# Patient Record
Sex: Female | Born: 1975 | Race: White | Hispanic: No | Marital: Married | Smoking: Never smoker
Health system: Southern US, Community
[De-identification: ages and names within clinical notes are randomized; demographics above are authoritative.]

## PROBLEM LIST (undated history)

## (undated) DIAGNOSIS — R519 Headache, unspecified: Secondary | ICD-10-CM

## (undated) DIAGNOSIS — R51 Headache: Secondary | ICD-10-CM

## (undated) DIAGNOSIS — Z6791 Unspecified blood type, Rh negative: Secondary | ICD-10-CM

## (undated) DIAGNOSIS — O26899 Other specified pregnancy related conditions, unspecified trimester: Secondary | ICD-10-CM

## (undated) HISTORY — PX: KNEE ARTHROSCOPY: SHX127

## (undated) HISTORY — PX: FACIAL COSMETIC SURGERY: SHX629

---

## 2006-08-30 ENCOUNTER — Emergency Department: Payer: Self-pay

## 2006-12-03 ENCOUNTER — Encounter: Payer: Self-pay | Admitting: Maternal and Fetal Medicine

## 2007-04-12 ENCOUNTER — Encounter: Payer: Self-pay | Admitting: Maternal & Fetal Medicine

## 2008-06-11 ENCOUNTER — Ambulatory Visit: Payer: Self-pay | Admitting: Internal Medicine

## 2011-03-08 ENCOUNTER — Emergency Department: Payer: Self-pay | Admitting: Emergency Medicine

## 2011-12-05 ENCOUNTER — Ambulatory Visit: Payer: Self-pay | Admitting: Internal Medicine

## 2011-12-05 LAB — URINALYSIS, COMPLETE

## 2011-12-05 LAB — WET PREP, GENITAL

## 2011-12-05 LAB — PREGNANCY, URINE: Pregnancy Test, Urine: NEGATIVE m[IU]/mL

## 2017-10-12 ENCOUNTER — Encounter: Payer: Self-pay | Admitting: Urology

## 2017-10-12 ENCOUNTER — Ambulatory Visit: Payer: BC Managed Care – PPO | Admitting: Urology

## 2017-10-12 VITALS — BP 102/70 | HR 88 | Ht 63.0 in | Wt 170.3 lb

## 2017-10-12 DIAGNOSIS — R32 Unspecified urinary incontinence: Secondary | ICD-10-CM | POA: Diagnosis not present

## 2017-10-12 LAB — URINALYSIS, COMPLETE
Bilirubin, UA: NEGATIVE
Glucose, UA: NEGATIVE
KETONES UA: NEGATIVE
Leukocytes, UA: NEGATIVE
NITRITE UA: NEGATIVE
Protein, UA: NEGATIVE
RBC, UA: NEGATIVE
Specific Gravity, UA: <1.005 — ABNORMAL LOW (ref 1.005–1.030)
UUROB: 0.2 mg/dL (ref 0.2–1.0)
pH, UA: 5.5 (ref 5.0–7.5)

## 2017-10-12 NOTE — Progress Notes (Signed)
10/12/2017 2:55 PM   Leslie Blanchard 07/04/1975 [id number]  Referring provider: No referring provider defined for this encounter.  Chief Complaint  Patient presents with  . Urinary Incontinence    HPI: I was consulted to assist the patient's urinary incontinence worsening in the last year.  She leaks with coughing sneezing exercise.  She has no urge incontinence or bedwetting.  She generally does not wear a pad but she can wear 2 or 3 when she is active and leak quite a bit.  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  She normally does not have nocturia.  Her flow is reasonable  She denies a history of kidney stones previous GU surgery and urinary tract infections.  She has not had a hysterectomy.  No neurologic issues.  Presentation is not been medically treated  Modifying factors: There are no other modifying factors  Associated signs and symptoms: There are no other associated signs and symptoms Aggravating and relieving factors: There are no other aggravating or relieving factors Severity: Moderate Duration: Persistent   PMH: History reviewed. No pertinent past medical history.  Surgical History:   Home Medications:  Allergies as of 10/12/2017      Reactions   Citalopram    Other reaction(s): Other (See Comments) Weight gain      Medication List        Accurate as of 10/12/17  2:55 PM. Always use your most recent med list.          cetirizine 10 MG tablet Commonly known as:  ZYRTEC Take by mouth.       Allergies:  Allergies  Allergen Reactions  . Citalopram     Other reaction(s): Other (See Comments) Weight gain    Family History: Family History  Problem Relation Age of Onset  . Prostate cancer Maternal Grandfather     Social History:  reports that she has never smoked. She has never used smokeless tobacco. She reports that she drinks alcohol. She reports that she does  not use drugs.  ROS: UROLOGY Frequent Urination?: Yes Hard to postpone urination?: No Burning/pain with urination?: No Get up at night to urinate?: No Leakage of urine?: Yes Urine stream starts and stops?: Yes Trouble starting stream?: No Do you have to strain to urinate?: No Blood in urine?: No Urinary tract infection?: No Sexually transmitted disease?: No Injury to kidneys or bladder?: No Painful intercourse?: No Weak stream?: No Currently pregnant?: No Vaginal bleeding?: No Last menstrual period?: n  Gastrointestinal Nausea?: No Vomiting?: No Indigestion/heartburn?: No Diarrhea?: No Constipation?: No  Constitutional Fever: No Night sweats?: No Weight loss?: No Fatigue?: No  Skin Skin rash/lesions?: No Itching?: No  Eyes Blurred vision?: No Double vision?: No  Ears/Nose/Throat Sore throat?: No Sinus problems?: No  Hematologic/Lymphatic Swollen glands?: No Easy bruising?: No  Cardiovascular Leg swelling?: No Chest pain?: No  Respiratory Cough?: No Shortness of breath?: No  Endocrine Excessive thirst?: No  Musculoskeletal Back pain?: No Joint pain?: No  Neurological Headaches?: No Dizziness?: No  Psychologic Depression?: No Anxiety?: No  Physical Exam: BP 102/70 (BP Location: Left Arm, Patient Position: Sitting, Cuff Size: Large)   Pulse 88   Ht 5\' 3"  (1.6 m)   Wt 170 lb 4.8 oz (77.2 kg)   BMI 30.17 kg/m   Constitutional:  Alert and oriented, No acute distress. HEENT: [place] AT, moist mucus membranes.  Trachea midline, no masses. Cardiovascular:  No clubbing, cyanosis, or edema. Respiratory: Normal respiratory effort, no increased work of breathing. GI: Abdomen is soft, nontender, nondistended, no abdominal masses GU: No CVA tenderness.  Mild grade 2 hypermobility the bladder neck and negative cough test and no prolapse Skin: No rashes, bruises or suspicious lesions. Lymph: No cervical or inguinal adenopathy. Neurologic: Grossly  intact, no focal deficits, moving all 4 extremities. Psychiatric: Normal mood and affect.  Laboratory Data: No results found for: WBC, HGB, HCT, MCV, PLT  No results found for: CREATININE  No results found for: PSA  No results found for: TESTOSTERONE  No results found for: HGBA1C  Urinalysis    Component Value Date/Time   COLORURINE RED 12/05/2011 1122   APPEARANCEUR BLOODY 12/05/2011 1122   LABSPEC SEE COMMENT 12/05/2011 1122   PHURINE see comment 12/05/2011 1122   GLUCOSEU see comment 12/05/2011 1122   HGBUR see comment 12/05/2011 1122   BILIRUBINUR SEE COMMENT 12/05/2011 1122   KETONESUR SEE COMMENT 12/05/2011 1122   PROTEINUR SEE COMMENT 12/05/2011 1122   NITRITE SEE COMMENT 12/05/2011 1122   LEUKOCYTESUR SEE COMMENT 12/05/2011 1122    Pertinent Imaging:   Assessment & Plan: Patient has stress urinary incontinence.  Her frequency is somewhat fluid dependent.  Role of urodynamics discussed.  Role of physical therapy discussed.  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  She would like to first get urodynamics which was reasonable  1. Urinary incontinence, unspecified type  - Urinalysis, Complete   No follow-ups on file.  Martina SinnerMACDIARMID,Vincente Asbridge A, MD  [facility] [address], [address] [place], [place] [address] [phone]

## 2018-01-05 ENCOUNTER — Other Ambulatory Visit: Payer: Self-pay | Admitting: Urology

## 2018-01-11 ENCOUNTER — Encounter: Payer: Self-pay | Admitting: Urology

## 2018-01-11 ENCOUNTER — Ambulatory Visit (INDEPENDENT_AMBULATORY_CARE_PROVIDER_SITE_OTHER): Payer: BC Managed Care – PPO | Admitting: Urology

## 2018-01-11 VITALS — BP 94/62 | HR 88 | Ht 63.0 in | Wt 168.0 lb

## 2018-01-11 DIAGNOSIS — N3946 Mixed incontinence: Secondary | ICD-10-CM

## 2018-01-11 NOTE — Progress Notes (Signed)
01/11/2018 3:47 PM   Sharyn Dross Apr 03, 1975 [id number]  Referring provider: No referring provider defined for this encounter.  No chief complaint on file.   HPI: I was consulted to assist the patient's urinary incontinence worsening in the last year.  She leaks with coughing sneezing exercise.  She has no urge incontinence or bedwetting.  She generally does not wear a pad but she can wear 2 or 3 when she is active and leak quite a bit.  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  She normally does not have nocturia.  Her flow is reasonable    Mild grade 2 hypermobility the bladder neck and negative cough test and no prolapse  Patient has stress urinary incontinence.  Her frequency is somewhat fluid dependent.  Role of urodynamics discussed.  Role of physical therapy discussed.  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   Today Frequency and incontinence are stable On urodynamics patient empty efficiently.  Maximum bladder capacity was 610 mL.  Bladder was unstable reaching a pressure of 9 cm of water and she did not leak but felt urgency.  She did not leak with a Valsalva pressure of 111 cm of water.  During voluntary voiding she voided 110 mL with a maximum flow of 8 mils per second.  Maximum voiding pressure was 24 cm of water.  Residual was 500 mL and then double voided after the test a similar amount.  EMG activity increased during the voiding phase.  Bladder neck descent at 1 cm.  The urodynamic note was signed and dictated and reviewed.  I discussed watchful waiting versus physical therapy versus a sling with my usual template.  Mesh issues discussed.   PMH: No past medical history on file.  Surgical History: No past surgical history on file.  Home Medications:  Allergies as of 01/11/2018      Reactions   Citalopram    Other reaction(s): Other (See  Comments) Weight gain      Medication List        Accurate as of 01/11/18  3:47 PM. Always use your most recent med list.          cetirizine 10 MG tablet Commonly known as:  ZYRTEC Take by mouth.       Allergies:  Allergies  Allergen Reactions  . Citalopram     Other reaction(s): Other (See Comments) Weight gain    Family History: Family History  Problem Relation Age of Onset  . Prostate cancer Maternal Grandfather     Social History:  reports that she has never smoked. She has never used smokeless tobacco. She reports that she drinks alcohol. She reports that she does not use drugs.  ROS:                                        Physical Exam: There were no vitals taken for this visit.  Constitutional:  Alert and oriented, No acute distress.   Laboratory Data: No results found for: WBC, HGB, HCT, MCV, PLT  No results found for: CREATININE  No results found for: PSA  No results found for: TESTOSTERONE  No results found for: HGBA1C  Urinalysis    Component Value Date/Time   COLORURINE RED  12/05/2011 1122   APPEARANCEUR Clear 10/12/2017 1437   LABSPEC SEE COMMENT 12/05/2011 1122   PHURINE see comment 12/05/2011 1122   GLUCOSEU Negative 10/12/2017 1437   GLUCOSEU see comment 12/05/2011 1122   HGBUR see comment 12/05/2011 1122   BILIRUBINUR Negative 10/12/2017 1437   BILIRUBINUR SEE COMMENT 12/05/2011 1122   KETONESUR SEE COMMENT 12/05/2011 1122   PROTEINUR Negative 10/12/2017 1437   PROTEINUR SEE COMMENT 12/05/2011 1122   NITRITE Negative 10/12/2017 1437   NITRITE SEE COMMENT 12/05/2011 1122   LEUKOCYTESUR Negative 10/12/2017 1437   LEUKOCYTESUR SEE COMMENT 12/05/2011 1122    Pertinent Imaging:   Assessment & Plan: 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  She wants to talk to her husband but likely will call and proceed with surgery.  Yes I can do it in  [place].  In public at school and at a game she leaked a lot sitting and coughing and it was very embarrassed  There are no diagnoses linked to this encounter.  No follow-ups on file.  Martina Sinner, MD  [facility] [address] [place], [place] [address] [phone]

## 2018-01-12 ENCOUNTER — Telehealth: Payer: Self-pay | Admitting: Radiology

## 2018-01-12 NOTE — Telephone Encounter (Signed)
Patient would like to proceed with sling bladder suspension with Dr Sherron Monday.  Dr Sherron Monday - need orders please.

## 2018-01-13 ENCOUNTER — Other Ambulatory Visit: Payer: Self-pay | Admitting: Radiology

## 2018-01-13 NOTE — Telephone Encounter (Signed)
LMOM. Need to discuss pubovaginal sling with Dr Sherron Monday.

## 2018-01-13 NOTE — Telephone Encounter (Signed)
Surgery scheduled on 02/22/2018 with Dr Sherron Monday. Appointments for [facility] teaching & post op have been made. Patient aware. Questions answered. Patient voices understanding.

## 2018-01-20 ENCOUNTER — Other Ambulatory Visit: Payer: Self-pay | Admitting: Radiology

## 2018-01-20 DIAGNOSIS — N393 Stress incontinence (female) (male): Secondary | ICD-10-CM

## 2018-01-20 MED ORDER — PHENAZOPYRIDINE HCL 100 MG PO TABS: 100.0000 mg | ORAL_TABLET | Freq: Once | ORAL | 0 refills | Status: AC

## 2018-02-15 ENCOUNTER — Encounter: Payer: Self-pay | Admitting: *Deleted

## 2018-02-15 ENCOUNTER — Inpatient Hospital Stay: Admission: RE | Admit: 2018-02-15 | Payer: Self-pay | Source: Ambulatory Visit

## 2018-02-15 ENCOUNTER — Encounter
Admission: RE | Admit: 2018-02-15 | Discharge: 2018-02-15 | Disposition: A | Discharge status: Home / Self Care | Payer: Self-pay | Source: Ambulatory Visit | Attending: Urology | Admitting: Urology

## 2018-02-15 ENCOUNTER — Other Ambulatory Visit: Payer: Self-pay

## 2018-02-15 HISTORY — DX: Other specified pregnancy related conditions, unspecified trimester: O26.899

## 2018-02-15 HISTORY — DX: Headache: R51

## 2018-02-15 HISTORY — DX: Headache, unspecified: R51.9

## 2018-02-15 HISTORY — DX: Unspecified blood type, rh negative: Z67.91

## 2018-02-15 NOTE — Patient Instructions (Signed)
Your procedure is scheduled on: 02-22-18 Report to Same Day Surgery 2nd floor medical mall [facility](Medical Mall Entrance-take elevator on left to 2nd floor.  Check in with surgery information desk.) To find out your arrival time please call [phone] between 1PM - 3PM on 02-19-18  Remember: Instructions that are not followed completely may result in serious medical risk, up to and including death, or upon the discretion of your surgeon and anesthesiologist your surgery may need to be rescheduled.    _x___ 1. Do not eat food after midnight the night before your procedure. You may drink clear liquids up to 2 hours before you are scheduled to arrive at the hospital for your procedure.  Do not drink clear liquids within 2 hours of your scheduled arrival to the hospital.  Clear liquids include  --Water or Apple juice without pulp  --Clear carbohydrate beverage such as ClearFast or Gatorade  --Black Coffee or Clear Tea (No milk, no creamers, do not add anything to the coffee or Tea Type 1 and type 2 diabetics should only drink water.   ____Ensure clear carbohydrate drink on the way to the hospital for bariatric patients  ____Ensure clear carbohydrate drink 3 hours before surgery for Dr Rutherford NailByrnett's patients if physician instructed.   No gum chewing or hard candies.     __x__ 2. No Alcohol for 24 hours before or after surgery.   __x__3. No Smoking or e-cigarettes for 24 prior to surgery.  Do not use any chewable tobacco products for at least 6 hour prior to surgery   ____  4. Bring all medications with you on the day of surgery if instructed.    __x__ 5. Notify your doctor if there is any change in your medical condition     (cold, fever, infections).    x___6. On the morning of surgery brush your teeth with toothpaste and water.  You may rinse your mouth with mouth wash if you wish.  Do not swallow any toothpaste or mouthwash.   Do not wear jewelry, make-up, hairpins, clips or nail polish.  Do  not wear lotions, powders, or perfumes. You may wear deodorant.  Do not shave 48 hours prior to surgery. Men may shave face and neck.  Do not bring valuables to the hospital.    [facility] is not responsible for any belongings or valuables.               Contacts, dentures or bridgework may not be worn into surgery.  Leave your suitcase in the car. After surgery it may be brought to your room.  For patients admitted to the hospital, discharge time is determined by your treatment team.  _  Patients discharged the day of surgery will not be allowed to drive home.  You will need someone to drive you home and stay with you the night of your procedure.    Please read over the following fact sheets that you were given:   [facility] Preparing for Surgery   ____ Take anti-hypertensive listed below, cardiac, seizure, asthma,     anti-reflux and psychiatric medicines. These include:  1. NONE  2.  3.  4.  5.  6.  ____Fleets enema or Magnesium Citrate as directed.   ____ Use CHG Soap or sage wipes as directed on instruction sheet   ____ Use inhalers on the day of surgery and bring to hospital day of surgery  ____ Stop Metformin and Janumet 2 days prior to surgery.  ____ Take 1/2 of usual insulin dose the night before surgery and none on the morning     surgery.   ____ Follow recommendations from Cardiologist, Pulmonologist or PCP regarding          stopping Aspirin, Coumadin, Plavix ,Eliquis, Effient, or Pradaxa, and Pletal.  X____Stop Anti-inflammatories such as Advil, Aleve, Ibuprofen, Motrin, Naproxen, Naprosyn, Goodies powders or aspirin products NOW-OK to take Tylenol    ____ Stop supplements until after surgery.     ____ Bring C-Pap to the hospital.

## 2018-02-16 ENCOUNTER — Ambulatory Visit (INDEPENDENT_AMBULATORY_CARE_PROVIDER_SITE_OTHER): Payer: BC Managed Care – PPO

## 2018-02-16 ENCOUNTER — Inpatient Hospital Stay: Admission: RE | Admit: 2018-02-16 | Payer: Self-pay | Source: Ambulatory Visit

## 2018-02-16 DIAGNOSIS — N393 Stress incontinence (female) (male): Secondary | ICD-10-CM | POA: Diagnosis not present

## 2018-02-16 NOTE — Patient Instructions (Signed)
.  buas

## 2018-02-19 DIAGNOSIS — N393 Stress incontinence (female) (male): Secondary | ICD-10-CM | POA: Diagnosis not present

## 2018-02-19 DIAGNOSIS — Z888 Allergy status to other drugs, medicaments and biological substances status: Secondary | ICD-10-CM | POA: Diagnosis not present

## 2018-02-19 NOTE — Progress Notes (Signed)
Pharmacy consult for Gentamicin for Surgical Prophylaxis  42 yo F for pubo-vaginal sling surgery  Ht 63 in Wt 79.4 kg (from office visit on 01/12/18) Adjusted Body weight= 63 kg  Will order Gentamicin 5 mg/kg based on previous weight of 79.4 kg (using adjusted body weight of 63 kg for dosing).  Scr from 01/12/18= 0.7    Bari MantisKristin Gaelan Glennon PharmD Clinical Pharmacist 02/19/2018

## 2018-02-21 MED ORDER — CEFAZOLIN SODIUM-DEXTROSE 2-4 GM/100ML-% IV SOLN
2.0000 g | INTRAVENOUS | Status: AC
  Administered 2018-02-22: 2 g via INTRAVENOUS

## 2018-02-22 ENCOUNTER — Other Ambulatory Visit: Payer: Self-pay

## 2018-02-22 ENCOUNTER — Encounter
Admission: RE | Disposition: A | Discharge status: Home / Self Care | Payer: Self-pay | Source: Ambulatory Visit | Attending: Urology

## 2018-02-22 ENCOUNTER — Ambulatory Visit
Admission: RE | Admit: 2018-02-22 | Discharge: 2018-02-22 | Disposition: A | Discharge status: Home / Self Care | Payer: BC Managed Care – PPO | Source: Ambulatory Visit | Attending: Urology | Admitting: Urology

## 2018-02-22 ENCOUNTER — Ambulatory Visit: Payer: BC Managed Care – PPO | Admitting: Certified Registered"

## 2018-02-22 DIAGNOSIS — N393 Stress incontinence (female) (male): Secondary | ICD-10-CM | POA: Diagnosis not present

## 2018-02-22 DIAGNOSIS — Z888 Allergy status to other drugs, medicaments and biological substances status: Secondary | ICD-10-CM | POA: Insufficient documentation

## 2018-02-22 HISTORY — PX: PUBOVAGINAL SLING: SHX1035

## 2018-02-22 LAB — POCT PREGNANCY, URINE: Preg Test, Ur: NEGATIVE

## 2018-02-22 SURGERY — CREATION, PUBOVAGINAL SLING
Anesthesia: General

## 2018-02-22 MED ORDER — GENTAMICIN SULFATE 40 MG/ML IJ SOLN
315.0000 mg | Freq: Once | INTRAVENOUS | Status: AC
  Administered 2018-02-22: 320 mg via INTRAVENOUS
  Filled 2018-02-22: qty 8

## 2018-02-22 MED ORDER — LIDOCAINE HCL (PF) 2 % IJ SOLN
INTRAMUSCULAR | Status: AC
  Filled 2018-02-22: qty 10

## 2018-02-22 MED ORDER — LIDOCAINE-EPINEPHRINE (PF) 1 %-1:200000 IJ SOLN
INTRAMUSCULAR | Status: AC
  Filled 2018-02-22: qty 30

## 2018-02-22 MED ORDER — LACTATED RINGERS IV SOLN
INTRAVENOUS | Status: DC
  Administered 2018-02-22 (×2): via INTRAVENOUS

## 2018-02-22 MED ORDER — MIDAZOLAM HCL 2 MG/2ML IJ SOLN
INTRAMUSCULAR | Status: DC | PRN
  Administered 2018-02-22: 2 mg via INTRAVENOUS

## 2018-02-22 MED ORDER — FLUORESCEIN SODIUM 10 % IV SOLN
INTRAVENOUS | Status: DC | PRN
  Administered 2018-02-22: 1 mL via INTRAVENOUS

## 2018-02-22 MED ORDER — METHYLENE BLUE 0.5 % INJ SOLN
INTRAVENOUS | Status: AC
  Filled 2018-02-22: qty 10

## 2018-02-22 MED ORDER — FENTANYL CITRATE (PF) 100 MCG/2ML IJ SOLN
INTRAMUSCULAR | Status: AC
  Administered 2018-02-22: 25 ug via INTRAVENOUS
  Filled 2018-02-22: qty 2

## 2018-02-22 MED ORDER — SODIUM CHLORIDE FLUSH 0.9 % IV SOLN
INTRAVENOUS | Status: AC
  Filled 2018-02-22: qty 10

## 2018-02-22 MED ORDER — LIDOCAINE HCL (CARDIAC) PF 100 MG/5ML IV SOSY
PREFILLED_SYRINGE | INTRAVENOUS | Status: DC | PRN
  Administered 2018-02-22: 100 mg via INTRAVENOUS

## 2018-02-22 MED ORDER — ACETAMINOPHEN 10 MG/ML IV SOLN
INTRAVENOUS | Status: DC | PRN
  Administered 2018-02-22: 1000 mg via INTRAVENOUS

## 2018-02-22 MED ORDER — FAMOTIDINE 20 MG PO TABS
ORAL_TABLET | ORAL | Status: AC
  Administered 2018-02-22: 20 mg via ORAL
  Filled 2018-02-22: qty 1

## 2018-02-22 MED ORDER — FENTANYL CITRATE (PF) 100 MCG/2ML IJ SOLN
INTRAMUSCULAR | Status: DC | PRN
  Administered 2018-02-22 (×3): 50 ug via INTRAVENOUS

## 2018-02-22 MED ORDER — FLUORESCEIN SODIUM 10 % IV SOLN
INTRAVENOUS | Status: AC
  Filled 2018-02-22: qty 5

## 2018-02-22 MED ORDER — MIDAZOLAM HCL 2 MG/2ML IJ SOLN
INTRAMUSCULAR | Status: AC
  Filled 2018-02-22: qty 2

## 2018-02-22 MED ORDER — ONDANSETRON HCL 4 MG/2ML IJ SOLN
INTRAMUSCULAR | Status: DC | PRN
  Administered 2018-02-22: 4 mg via INTRAVENOUS

## 2018-02-22 MED ORDER — DEXAMETHASONE SODIUM PHOSPHATE 10 MG/ML IJ SOLN
INTRAMUSCULAR | Status: DC | PRN
  Administered 2018-02-22: 10 mg via INTRAVENOUS

## 2018-02-22 MED ORDER — FAMOTIDINE 20 MG PO TABS
20.0000 mg | ORAL_TABLET | Freq: Once | ORAL | Status: AC
  Administered 2018-02-22: 20 mg via ORAL

## 2018-02-22 MED ORDER — PROPOFOL 10 MG/ML IV BOLUS
INTRAVENOUS | Status: DC | PRN
  Administered 2018-02-22: 160 mg via INTRAVENOUS

## 2018-02-22 MED ORDER — PROPOFOL 10 MG/ML IV BOLUS
INTRAVENOUS | Status: AC
  Filled 2018-02-22: qty 20

## 2018-02-22 MED ORDER — CEFAZOLIN SODIUM-DEXTROSE 2-4 GM/100ML-% IV SOLN
INTRAVENOUS | Status: AC
  Filled 2018-02-22: qty 100

## 2018-02-22 MED ORDER — FENTANYL CITRATE (PF) 100 MCG/2ML IJ SOLN
INTRAMUSCULAR | Status: AC
  Filled 2018-02-22: qty 2

## 2018-02-22 MED ORDER — LIDOCAINE-EPINEPHRINE (PF) 1 %-1:200000 IJ SOLN
INTRAMUSCULAR | Status: DC | PRN
  Administered 2018-02-22: 5 mL

## 2018-02-22 MED ORDER — ACETAMINOPHEN 10 MG/ML IV SOLN
INTRAVENOUS | Status: AC
  Filled 2018-02-22: qty 100

## 2018-02-22 MED ORDER — ESTRADIOL 0.1 MG/GM VA CREA
TOPICAL_CREAM | VAGINAL | Status: DC | PRN
  Administered 2018-02-22: 1 via VAGINAL

## 2018-02-22 MED ORDER — ONDANSETRON HCL 4 MG/2ML IJ SOLN: 4.0000 mg | Freq: Once | INTRAMUSCULAR | Status: DC | PRN

## 2018-02-22 MED ORDER — FENTANYL CITRATE (PF) 100 MCG/2ML IJ SOLN
25.0000 ug | INTRAMUSCULAR | Status: DC | PRN
  Administered 2018-02-22 (×2): 25 ug via INTRAVENOUS

## 2018-02-22 MED ORDER — BACITRACIN 50000 UNITS IM SOLR
INTRAMUSCULAR | Status: AC
  Filled 2018-02-22: qty 1

## 2018-02-22 MED ORDER — ESTROGENS, CONJUGATED 0.625 MG/GM VA CREA
TOPICAL_CREAM | VAGINAL | Status: AC
  Filled 2018-02-22: qty 30

## 2018-02-22 MED ORDER — FLUORESCEIN SODIUM 10 % IV SOLN
INTRAVENOUS | Status: DC | PRN
  Administered 2018-02-22: 500 mg via INTRAVENOUS

## 2018-02-22 MED ORDER — DEXAMETHASONE SODIUM PHOSPHATE 10 MG/ML IJ SOLN
INTRAMUSCULAR | Status: AC
  Filled 2018-02-22: qty 1

## 2018-02-22 MED ORDER — GENTAMICIN SULFATE 40 MG/ML IJ SOLN
INTRAMUSCULAR | Status: AC
  Filled 2018-02-22: qty 2

## 2018-02-22 MED ORDER — ONDANSETRON HCL 4 MG/2ML IJ SOLN
INTRAMUSCULAR | Status: AC
  Filled 2018-02-22: qty 2

## 2018-02-22 SURGICAL SUPPLY — 47 items
BAG URINE DRAINAGE (UROLOGICAL SUPPLIES) ×3 IMPLANT
BLADE CLIPPER SURG (BLADE) ×3 IMPLANT
BLADE SURG 15 STRL LF DISP TIS (BLADE) ×1 IMPLANT
BLADE SURG 15 STRL SS (BLADE) ×2
BLADE SURG SZ10 CARB STEEL (BLADE) ×3 IMPLANT
CANISTER SUCT 1200ML W/VALVE (MISCELLANEOUS) ×3 IMPLANT
CATH FOLEY SIL 2WAY 14FR5CC (CATHETERS) ×3 IMPLANT
COVER MAYO STAND STRL (DRAPES) ×6 IMPLANT
DERMABOND ADVANCED (GAUZE/BANDAGES/DRESSINGS) ×2
DERMABOND ADVANCED .7 DNX12 (GAUZE/BANDAGES/DRESSINGS) ×1 IMPLANT
DRAPE LAP W/FLUID (DRAPES) ×3 IMPLANT
DRAPE UNDER BUTTOCK W/FLU (DRAPES) ×3 IMPLANT
ELECT REM PT RETURN 9FT ADLT (ELECTROSURGICAL) ×3
ELECTRODE REM PT RTRN 9FT ADLT (ELECTROSURGICAL) ×1 IMPLANT
GAUZE 4X4 16PLY RFD (DISPOSABLE) IMPLANT
GAUZE PACK 2X3YD (MISCELLANEOUS) ×3 IMPLANT
GLOVE BIO SURGEON STRL SZ7.5 (GLOVE) ×6 IMPLANT
GOWN STRL REUS W/ TWL LRG LVL3 (GOWN DISPOSABLE) ×2 IMPLANT
GOWN STRL REUS W/ TWL XL LVL3 (GOWN DISPOSABLE) ×1 IMPLANT
GOWN STRL REUS W/TWL LRG LVL3 (GOWN DISPOSABLE) ×4
GOWN STRL REUS W/TWL XL LVL3 (GOWN DISPOSABLE) ×2
HOLDER FOLEY CATH W/STRAP (MISCELLANEOUS) ×3 IMPLANT
KIT TURNOVER KIT A (KITS) ×3 IMPLANT
NEEDLE HYPO 22GX1.5 SAFETY (NEEDLE) ×3 IMPLANT
PACK BASIN MINOR ARMC (MISCELLANEOUS) ×3 IMPLANT
PENCIL ELECTRO HAND CTR (MISCELLANEOUS) ×3 IMPLANT
PLUG CATH AND CAP STER (CATHETERS) ×3 IMPLANT
RETRACTOR STERILE 25.8CMX11.3 (INSTRUMENTS) ×3 IMPLANT
RING RETRACTOR 18.6X8.9 3309G (MISCELLANEOUS) ×3 IMPLANT
RING RETRACTOR 28.3X18.3 3308G (MISCELLANEOUS) ×3 IMPLANT
SET CYSTO W/LG BORE CLAMP LF (SET/KITS/TRAYS/PACK) ×3 IMPLANT
SET YANKAUER POOLE SUCT (MISCELLANEOUS) ×3 IMPLANT
SLING SUPRIS RETROPUBIC KIT (Miscellaneous) ×3 IMPLANT
SURGILUBE 2OZ TUBE FLIPTOP (MISCELLANEOUS) ×3 IMPLANT
SUT VIC AB 2-0 CT1 27 (SUTURE) ×4
SUT VIC AB 2-0 CT1 TAPERPNT 27 (SUTURE) ×2 IMPLANT
SUT VIC AB 2-0 SH 27 (SUTURE)
SUT VIC AB 2-0 SH 27XBRD (SUTURE) IMPLANT
SUT VIC AB 4-0 PS2 18 (SUTURE) ×3 IMPLANT
SYR 10ML LL (SYRINGE) ×3 IMPLANT
SYR BULB IRRIG 60ML STRL (SYRINGE) ×3 IMPLANT
SYR CONTROL 10ML (SYRINGE) ×3 IMPLANT
TRAY PREP VAG/GEN (MISCELLANEOUS) ×3 IMPLANT
TUBING CONNECTING 10 (TUBING) ×4 IMPLANT
TUBING CONNECTING 10' (TUBING) ×2
WATER STERILE IRR 1000ML POUR (IV SOLUTION) IMPLANT
WATER STERILE IRR 3000ML UROMA (IV SOLUTION) ×3 IMPLANT

## 2018-02-22 NOTE — Progress Notes (Signed)
Cardiac and HS normal Chest clear Detailed post op

## 2018-02-22 NOTE — Discharge Instructions (Addendum)
I have reviewed discharge instructions in detail with the patient. They will follow-up with me or their physician as scheduled. My nurse will also be calling the patients as per protocol.  °AMBULATORY SURGERY  °DISCHARGE INSTRUCTIONS ° ° °1) The drugs that you were given will stay in your system until tomorrow so for the next 24 hours you should not: ° °A) Drive an automobile °B) Make any legal decisions °C) Drink any alcoholic beverage ° ° °2) You may resume regular meals tomorrow.  Today it is better to start with liquids and gradually work up to solid foods. ° °You may eat anything you prefer, but it is better to start with liquids, then soup and crackers, and gradually work up to solid foods. ° ° °3) Please notify your doctor immediately if you have any unusual bleeding, trouble breathing, redness and pain at the surgery site, drainage, fever, or pain not relieved by medication. ° ° ° °4) Additional Instructions: ° ° ° ° ° ° ° °Please contact your physician with any problems or Same Day Surgery at [phone], Monday through Friday 6 am to 4 pm, or [place] [facility] number at [phone]. °

## 2018-02-22 NOTE — Op Note (Signed)
Preoperative diagnosis: Stress urinary incontinence Postoperative diagnosis: Stress urinary incontinence Surgery: Sling cystourethropexy and cystoscopy Surgeon: Dr. Lorin PicketScott Karesha Trzcinski  The patient was prepped and draped in the usual fashion for the above diagnosis.  Extra care was taken with leg positioning to minimize the risk of compartment syndrome and neuropathy and deep vein thrombosis.  I made 2 less than 1 cm incisions 1 fingerbreadth above the symphysis pubis 1.5 cm lateral to the midline.  Usual retractors were utilized.  Preoperative antibiotics were given.  I was diligent in making a suburethral two centimeter incision underneath the mid urethra at appropriate depth after instilling 3 cc of the lidocaine epinephrine mixture.  I did my usual sharp and blunt dissection exposing the urethrovesical angle bilaterally.  With the bladder emptied I passed a trocar on top of and along the back of the symphysis pubis onto the pulp of my index finger bilaterally.  I used my box technique.  I cystoscoped the patient.  There was no injury to bladder.  There is no movement of bladder with movement of trocar outside bladder.  Urethra was normal.  There was excellent efflux blot bilaterally  With the bladder empty I attached the mesh and the described technique and brought up to the retropubic space.  It was tensioned over the fat part of a moderate size with a clamp.  There was appropriate hypermobility.  It laid nice and flat.  It had no spring back effect.  I was very pleased with the tensioning and position of the sling check 3 times  I closed the anterior vaginal wall with running 2-0 Vicryl on a CT1 needle followed by 2 interrupted sutures.  I cut the mesh below the skin level and closed each abdominal incision with interrupted 4-0 Vicryl followed by Dermabond.  Vaginal pack with cream applied.  Leg position was good.  Blood loss less than 50 mL.  Hopefully the operation will reach the patient's  treatment goal

## 2018-02-22 NOTE — Anesthesia Postprocedure Evaluation (Signed)
Anesthesia Post Note  Patient: Leslie Blanchard  Procedure(s) Performed: Leonides GrillsPUBO-VAGINAL SLING (N/A )  Patient location during evaluation: PACU Anesthesia Type: General Level of consciousness: awake and alert Pain management: pain level controlled Vital Signs Assessment: post-procedure vital signs reviewed and stable Respiratory status: spontaneous breathing and respiratory function stable Cardiovascular status: stable Anesthetic complications: no     Last Vitals:  Vitals:   02/22/18 0939 02/22/18 1242  BP:  108/74  Pulse:  80  Resp: 16 12  Temp: (!) 36.2 C   SpO2: 98% 100%    Last Pain:  Vitals:   02/22/18 0939  TempSrc: Temporal                 Antonela Freiman K

## 2018-02-22 NOTE — OR Nursing (Signed)
BSC void 100cc canary yellow; post void residual 1ml x 2 readings.  Dr. Sherron MondayMacDiarmid notified of same via tele.  Per MD, instruct pt only needs to self cath if she feels like she needs to void but can't.  Notified patient of same.

## 2018-02-22 NOTE — Anesthesia Preprocedure Evaluation (Signed)
Anesthesia Evaluation  Patient identified by MRN, date of birth, ID band Patient awake    Reviewed: Allergy & Precautions, NPO status , Patient's Chart, lab work & pertinent test results  History of Anesthesia Complications Negative for: history of anesthetic complications  Airway Mallampati: II       Dental   Pulmonary neg sleep apnea, neg COPD,           Cardiovascular (-) hypertension(-) Past MI and (-) CHF (-) dysrhythmias (-) Valvular Problems/Murmurs     Neuro/Psych neg Seizures    GI/Hepatic Neg liver ROS, neg GERD  ,  Endo/Other  neg diabetes  Renal/GU negative Renal ROS     Musculoskeletal   Abdominal   Peds  Hematology   Anesthesia Other Findings   Reproductive/Obstetrics                             Anesthesia Physical Anesthesia Plan  ASA: II  Anesthesia Plan: General   Post-op Pain Management:    Induction:   PONV Risk Score and Plan: 3 and Dexamethasone, Ondansetron and Midazolam  Airway Management Planned: LMA  Additional Equipment:   Intra-op Plan:   Post-operative Plan:   Informed Consent: I have reviewed the patients History and Physical, chart, labs and discussed the procedure including the risks, benefits and alternatives for the proposed anesthesia with the patient or authorized representative who has indicated his/her understanding and acceptance.     Plan Discussed with:   Anesthesia Plan Comments:         Anesthesia Quick Evaluation  

## 2018-02-22 NOTE — H&P (Signed)
HPI: I was consulted to assist the patient's urinary incontinence worsening in the last year. She leaks with coughing sneezing exercise. She has no urge incontinence or bedwetting. She generally does not wear a pad but she can wear 2 or 3 when she is active and leak quite a bit.  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 She normally does not have nocturia. Her flow is reasonable    Mild grade 2 hypermobility the bladder neck and negative cough test and no prolapse  Patient has stress urinary incontinence. Her frequency is somewhat fluid dependent. 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   Today Frequency and incontinence are stable On urodynamics patient empty efficiently.  Maximum bladder capacity was 610 mL.  Bladder was unstable reaching a pressure of 9 cm of water and she did not leak but felt urgency.  She did not leak with a Valsalva pressure of 111 cm of water.  During voluntary voiding she voided 110 mL with a maximum flow of 8 mils per second.  Maximum voiding pressure was 24 cm of water.  Residual was 500 mL and then double voided after the test a similar amount.  EMG activity increased during the voiding phase.  Bladder neck descent at 1 cm.  The urodynamic note was signed and dictated and reviewed.  I discussed watchful waiting versus physical therapy versus a sling with my usual template.  Mesh issues discussed.   PMH: No past medical history on file.  Surgical History: No past surgical history on file.  Home Medications:       Allergies as of 01/11/2018      Reactions   Citalopram    Other reaction(s): Other (See Comments) Weight gain               Medication List            Accurate as of 01/11/18  3:47 PM. Always use your most recent med list.             cetirizine 10 MG tablet Commonly known as:  ZYRTEC Take by mouth.       Allergies:       Allergies  Allergen Reactions  . Citalopram     Other reaction(s): Other (See Comments) Weight gain    Family History:      Family History  Problem Relation Age of Onset  . Prostate cancer Maternal Grandfather     Social History:  reports that she has never smoked. She has never used smokeless tobacco. She reports that she drinks alcohol. She reports that she does not use drugs.  ROS:              Physical Exam: There were no vitals taken for this visit.  Constitutional:  Alert and oriented, No acute distress.   Laboratory Data: RecentLabs  No results found for: WBC, HGB, HCT, MCV, PLT    RecentLabs  No results found for: CREATININE    RecentLabs  No results found for: PSA    RecentLabs  No results found for: TESTOSTERONE    RecentLabs  No results found for: HGBA1C    Urinalysis Labs(Brief)          Component Value Date/Time   COLORURINE RED 12/05/2011 1122   APPEARANCEUR Clear 10/12/2017 1437  LABSPEC SEE COMMENT 12/05/2011 1122   PHURINE see comment 12/05/2011 1122   GLUCOSEU Negative 10/12/2017 1437   GLUCOSEU see comment 12/05/2011 1122   HGBUR see comment 12/05/2011 1122   BILIRUBINUR Negative 10/12/2017 1437   BILIRUBINUR SEE COMMENT 12/05/2011 1122   KETONESUR SEE COMMENT 12/05/2011 1122   PROTEINUR Negative 10/12/2017 1437   PROTEINUR SEE COMMENT 12/05/2011 1122   NITRITE Negative 10/12/2017 1437   NITRITE SEE COMMENT 12/05/2011 1122   LEUKOCYTESUR Negative 10/12/2017 1437   LEUKOCYTESUR SEE COMMENT 12/05/2011 1122      Pertinent Imaging:   Assessment & Plan: 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  She wants to talk to her husband but likely will call and proceed with surgery.  [place].  In public at school and at a game she leaked a lot sitting and coughing and it was very embarrassed  After a thorough review of the management options for the patient's condition the patient  elected to proceed with surgical therapy as noted above. We have discussed the potential benefits and risks of the procedure, side effects of the proposed treatment, the likelihood of the patient achieving the goals of the procedure, and any potential problems that might occur during the procedure or recuperation. Informed consent has been obtained.

## 2018-02-22 NOTE — Interval H&P Note (Signed)
History and Physical Interval Note:  02/22/2018 11:03 AM  Leslie Blanchard  has presented today for surgery, with the diagnosis of stress incontinence  The various methods of treatment have been discussed with the patient and family. After consideration of risks, benefits and other options for treatment, the patient has consented to  Procedure(s): PUBO-VAGINAL SLING (N/A) as a surgical intervention .  The patient's history has been reviewed, patient examined, no change in status, stable for surgery.  I have reviewed the patient's chart and labs.  Questions were answered to the patient's satisfaction.     [name]

## 2018-02-22 NOTE — Anesthesia Procedure Notes (Signed)
Procedure Name: LMA Insertion Performed by: Tranisha Tissue, CRNA Pre-anesthesia Checklist: Patient identified, Patient being monitored, Timeout performed, Emergency Drugs available and Suction available Patient Re-evaluated:Patient Re-evaluated prior to induction Oxygen Delivery Method: Circle system utilized Preoxygenation: Pre-oxygenation with 100% oxygen Induction Type: IV induction Ventilation: Mask ventilation without difficulty LMA: LMA inserted LMA Size: 3.5 Tube type: Oral Number of attempts: 1 Placement Confirmation: positive ETCO2 and breath sounds checked- equal and bilateral Tube secured with: Tape Dental Injury: Teeth and Oropharynx as per pre-operative assessment        

## 2018-02-22 NOTE — Transfer of Care (Signed)
Immediate Anesthesia Transfer of Care Note  Patient: Leslie Blanchard  Procedure(s) Performed: Leonides GrillsPUBO-VAGINAL SLING (N/A )  Patient Location: PACU  Anesthesia Type:General  Level of Consciousness: awake, alert  and responds to stimulation  Airway & Oxygen Therapy: Patient Spontanous Breathing and Patient connected to face mask oxygen  Post-op Assessment: Report given to RN and Post -op Vital signs reviewed and stable  Post vital signs: Reviewed and stable  Last Vitals:  Vitals Value Taken Time  BP 108/74 02/22/2018 12:42 PM  Temp    Pulse 84 02/22/2018 12:42 PM  Resp 14 02/22/2018 12:42 PM  SpO2 100 % 02/22/2018 12:42 PM  Vitals shown include unvalidated device data.  Last Pain:  Vitals:   02/22/18 0939  TempSrc: Temporal         Complications: No apparent anesthesia complications

## 2018-02-22 NOTE — Anesthesia Post-op Follow-up Note (Signed)
Anesthesia QCDR form completed.        

## 2018-02-23 ENCOUNTER — Encounter: Payer: Self-pay | Admitting: Urology

## 2018-02-23 NOTE — Progress Notes (Signed)
Patient states she is feeling well, minimal discomfort, slept well last evening.

## 2018-02-24 ENCOUNTER — Encounter: Payer: Self-pay | Admitting: Urology

## 2018-03-01 ENCOUNTER — Ambulatory Visit (INDEPENDENT_AMBULATORY_CARE_PROVIDER_SITE_OTHER): Payer: BC Managed Care – PPO | Admitting: Urology

## 2018-03-01 ENCOUNTER — Encounter: Payer: Self-pay | Admitting: Urology

## 2018-03-01 VITALS — BP 112/78 | HR 105 | Ht 63.0 in | Wt 173.7 lb

## 2018-03-01 DIAGNOSIS — N3946 Mixed incontinence: Secondary | ICD-10-CM | POA: Diagnosis not present

## 2018-03-01 LAB — URINALYSIS, COMPLETE
Bilirubin, UA: NEGATIVE
Glucose, UA: NEGATIVE
Ketones, UA: NEGATIVE
Leukocytes, UA: NEGATIVE
NITRITE UA: NEGATIVE
Protein, UA: NEGATIVE
Specific Gravity, UA: >1.03 — ABNORMAL HIGH (ref 1.005–1.030)
Urobilinogen, Ur: 0.2 mg/dL (ref 0.2–1.0)
pH, UA: 5.5 (ref 5.0–7.5)

## 2018-03-01 LAB — BLADDER SCAN AMB NON-IMAGING: Scan Result: 66

## 2018-03-01 LAB — MICROSCOPIC EXAMINATION: RBC, UA: NONE SEEN /hpf (ref 0–2)
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
## 2018-03-04 LAB — CULTURE, URINE COMPREHENSIVE

## 2018-03-08 ENCOUNTER — Ambulatory Visit: Payer: BC Managed Care – PPO | Admitting: Urology

## 2018-03-08 ENCOUNTER — Telehealth: Payer: Self-pay

## 2018-03-08 DIAGNOSIS — B958 Unspecified staphylococcus as the cause of diseases classified elsewhere: Secondary | ICD-10-CM

## 2018-03-08 MED ORDER — NITROFURANTOIN MACROCRYSTAL 100 MG PO CAPS: 100.0000 mg | ORAL_CAPSULE | Freq: Two times a day (BID) | ORAL | 0 refills | Status: AC

## 2018-03-08 NOTE — Telephone Encounter (Signed)
-----   Message from Alfredo MartinezScott MacDiarmid, MD sent at 03/05/2018  4:14 PM EST ----- Macrodantin 100 mg twice a day for 1 week   ----- Message ----- From: Debbe BalesGlanton, Anesha Hackert, CMA Sent: 03/04/2018   4:24 PM EST To: Alfredo MartinezScott MacDiarmid, MD   ----- Message ----- From: Interface, [facility] Lab Results In Sent: 03/01/2018   4:36 PM EST To: Jennette KettleBua Clinical

## 2018-03-08 NOTE — Telephone Encounter (Signed)
Called pt informed her of the information below, pt gave verbal understanding. RX sent in.

## 2018-03-22 ENCOUNTER — Ambulatory Visit: Payer: BC Managed Care – PPO | Admitting: Urology

## 2018-04-02 NOTE — Progress Notes (Signed)
Continuous Intermittent Catheterization  Patient is present today for a teaching of self I & O Catheterization. Patient was given detailed verbal and printed instructions of self catheterization. Demonstration was given and discussed with patient. She did not wish to preform cath at this visit. Patient was given a sample bag with supplies to take home. Patient is to only use if needed after surgery  Preformed by: Eligha Bridegroom, CMA

## 2018-05-10 ENCOUNTER — Ambulatory Visit: Payer: BC Managed Care – PPO | Admitting: Urology

## 2018-05-24 ENCOUNTER — Ambulatory Visit: Payer: BC Managed Care – PPO | Admitting: Urology

## 2021-04-16 ENCOUNTER — Other Ambulatory Visit: Payer: Self-pay | Admitting: Nurse Practitioner

## 2021-04-16 DIAGNOSIS — Z1231 Encounter for screening mammogram for malignant neoplasm of breast: Secondary | ICD-10-CM

## 2021-05-01 ENCOUNTER — Ambulatory Visit
Admission: RE | Admit: 2021-05-01 | Discharge: 2021-05-01 | Disposition: A | Discharge status: Home / Self Care | Payer: BC Managed Care – PPO | Source: Ambulatory Visit | Attending: Nurse Practitioner | Admitting: Nurse Practitioner

## 2021-05-01 ENCOUNTER — Other Ambulatory Visit: Payer: Self-pay

## 2021-05-01 DIAGNOSIS — Z1231 Encounter for screening mammogram for malignant neoplasm of breast: Secondary | ICD-10-CM | POA: Diagnosis not present

## 2022-03-31 ENCOUNTER — Other Ambulatory Visit: Payer: Self-pay | Admitting: Nurse Practitioner

## 2022-03-31 DIAGNOSIS — Z1231 Encounter for screening mammogram for malignant neoplasm of breast: Secondary | ICD-10-CM

## 2022-05-12 ENCOUNTER — Ambulatory Visit
Admission: RE | Admit: 2022-05-12 | Discharge: 2022-05-12 | Disposition: A | Discharge status: Home / Self Care | Payer: BC Managed Care – PPO | Source: Ambulatory Visit | Attending: Nurse Practitioner | Admitting: Nurse Practitioner

## 2022-05-12 DIAGNOSIS — Z1231 Encounter for screening mammogram for malignant neoplasm of breast: Secondary | ICD-10-CM | POA: Diagnosis present

## 2024-02-09 IMAGING — MG MM DIGITAL SCREENING BILAT W/ TOMO AND CAD
6 of 10 series · 6 of 30 positions shown · non-contrast
Comparison: None.

CLINICAL DATA: Screening.

EXAM:
DIGITAL SCREENING BILATERAL MAMMOGRAM WITH TOMOSYNTHESIS AND CAD
TECHNIQUE: Bilateral screening digital craniocaudal and mediolateral oblique
mammograms were obtained. Bilateral screening digital breast
tomosynthesis was performed. The images were evaluated with
computer-aided detection.

[R MLO synth-2D]
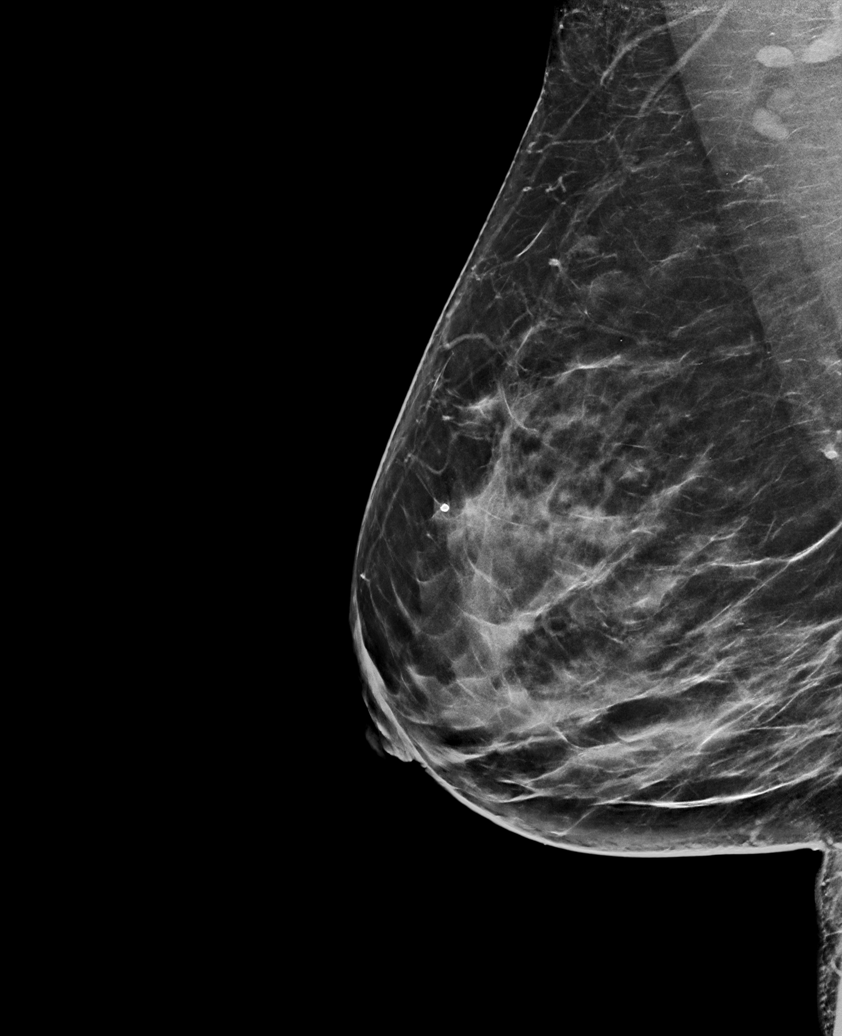

[L CC synth-2D]
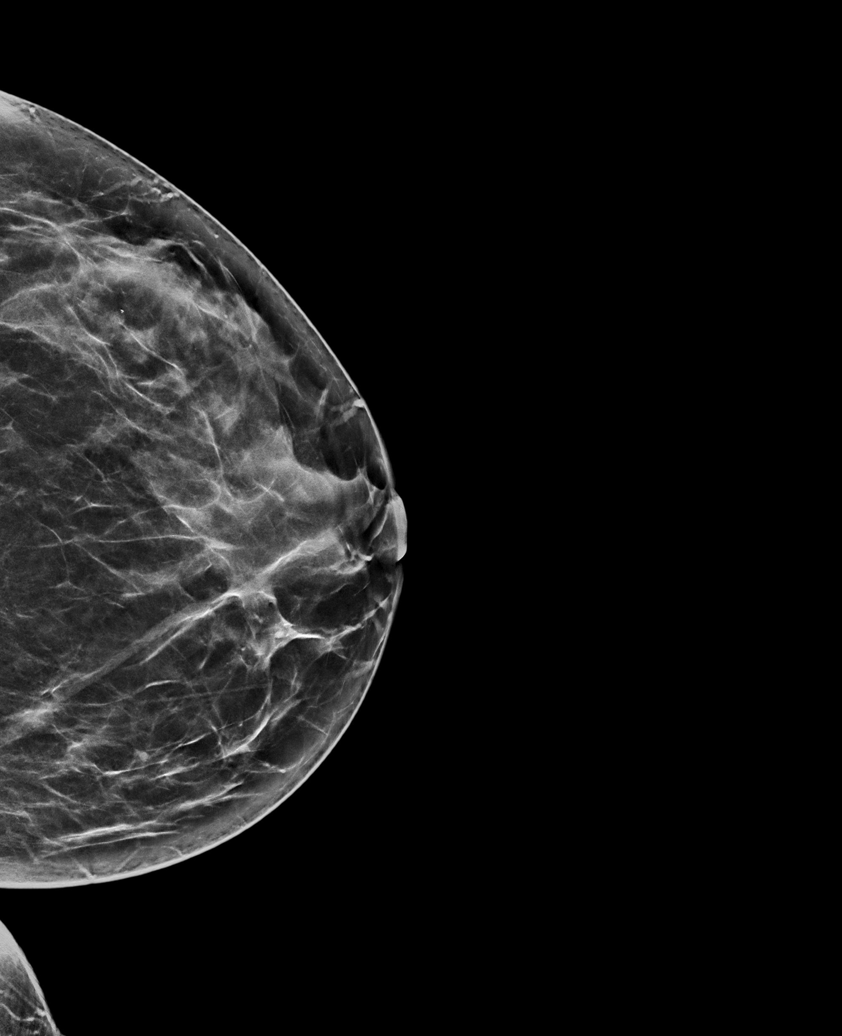

[L MLO synth-2D (1 of 2)]
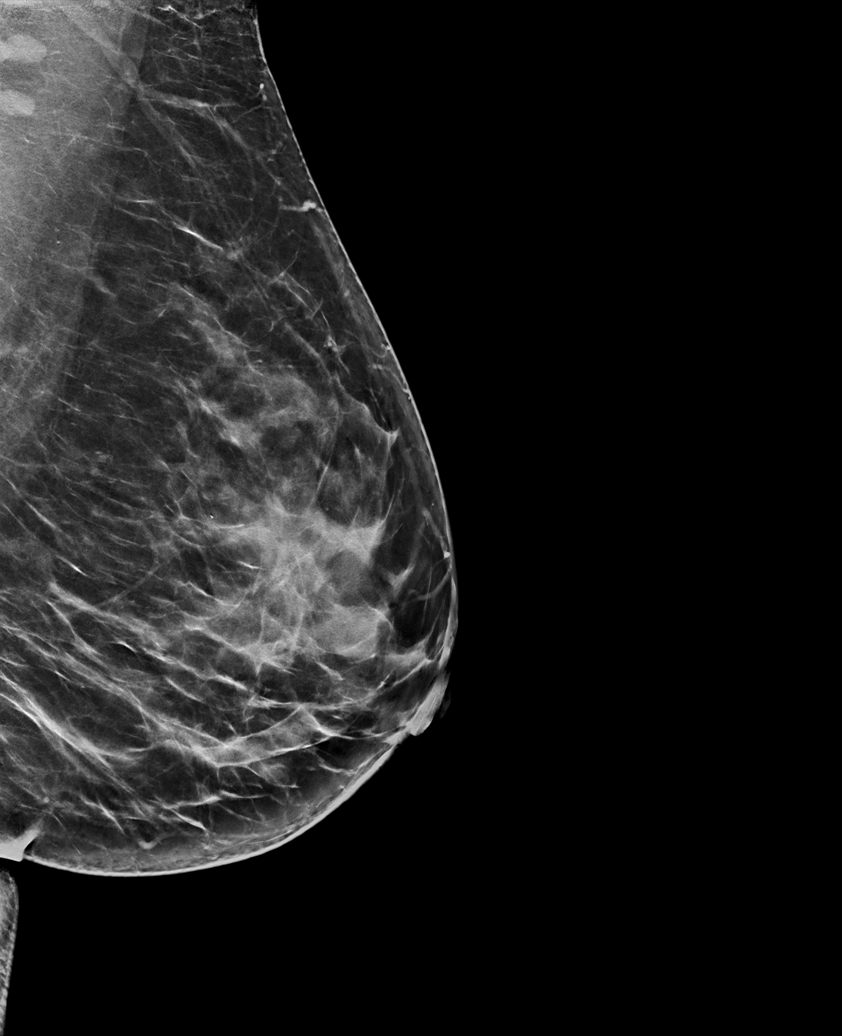

[R CC synth-2D]
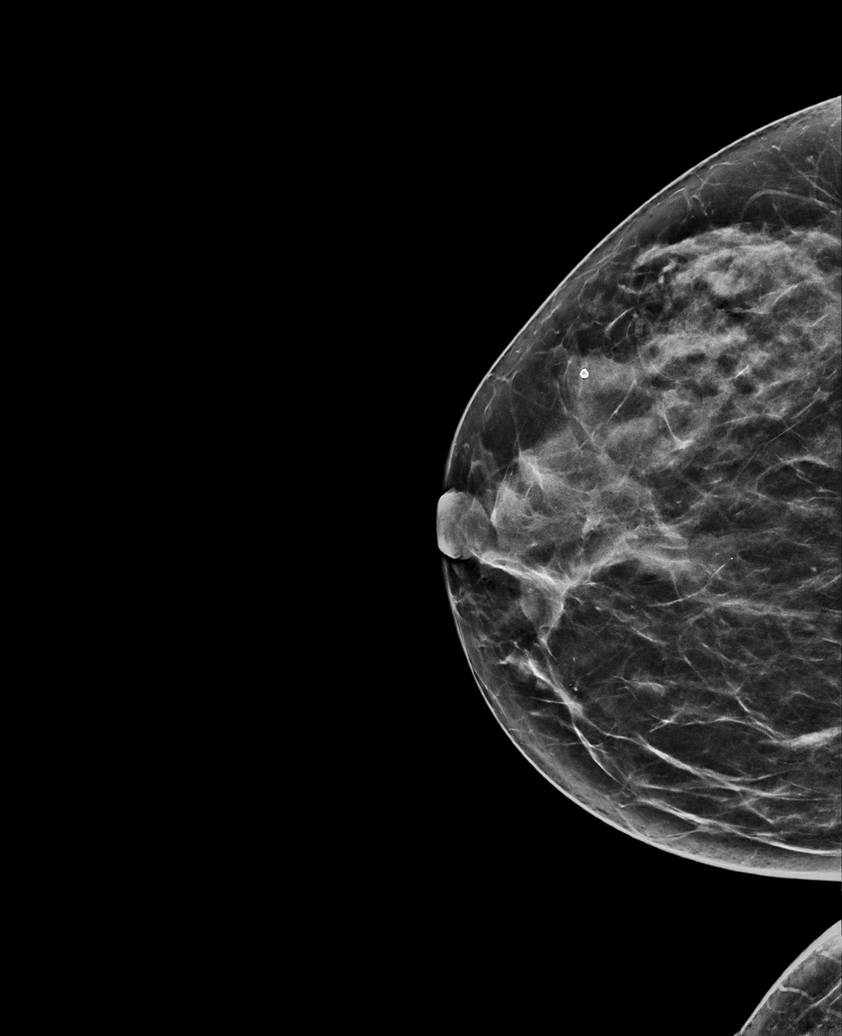

[L MLO synth-2D (2 of 2)]
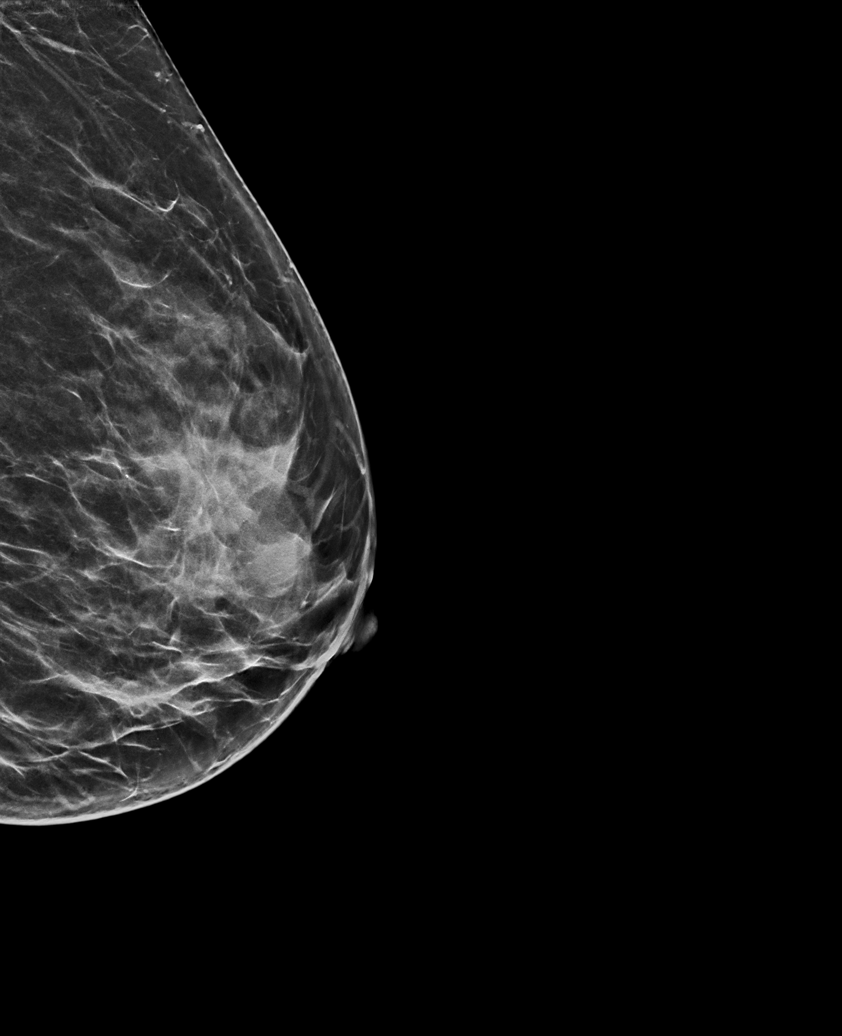

[R MLO tomo · tomo slice 38/75.0]
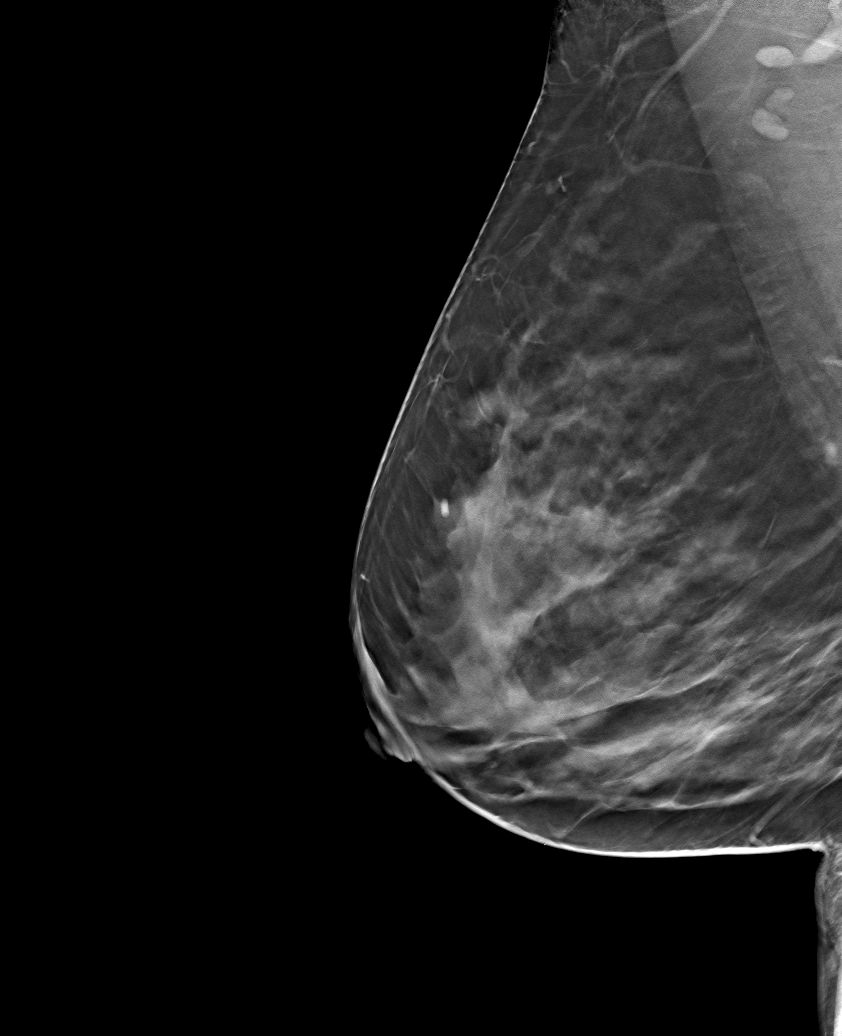

[6 of 30 positions shown; findings below may reference images not displayed]

ACR Breast Density Category c: The breast tissue is heterogeneously
dense, which may obscure small masses
FINDINGS: There are no findings suspicious for malignancy.
IMPRESSION: No mammographic evidence of malignancy. A result letter of this
screening mammogram will be mailed directly to the patient.

RECOMMENDATION:
Screening mammogram in one year. (Code:C8-T-HNK)

BI-RADS CATEGORY  1: Negative.
